# Patient Record
Sex: Male | Born: 2000 | Race: Black or African American | Hispanic: No | Marital: Single | Smoking: Never smoker
Health system: Southern US, Community
[De-identification: ages and names within clinical notes are randomized; demographics above are authoritative.]

---
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
## 2020-08-19 DIAGNOSIS — Z202 Contact with and (suspected) exposure to infections with a predominantly sexual mode of transmission: Secondary | ICD-10-CM | POA: Diagnosis not present

## 2020-08-19 DIAGNOSIS — Z708 Other sex counseling: Secondary | ICD-10-CM | POA: Diagnosis not present

## 2020-08-19 DIAGNOSIS — Z113 Encounter for screening for infections with a predominantly sexual mode of transmission: Secondary | ICD-10-CM | POA: Diagnosis not present

## 2020-08-19 DIAGNOSIS — Z114 Encounter for screening for human immunodeficiency virus [HIV]: Secondary | ICD-10-CM | POA: Diagnosis not present

## 2021-04-15 DIAGNOSIS — Z112 Encounter for screening for other bacterial diseases: Secondary | ICD-10-CM | POA: Diagnosis not present

## 2021-04-15 DIAGNOSIS — Z114 Encounter for screening for human immunodeficiency virus [HIV]: Secondary | ICD-10-CM | POA: Diagnosis not present

## 2021-04-15 DIAGNOSIS — Z0389 Encounter for observation for other suspected diseases and conditions ruled out: Secondary | ICD-10-CM | POA: Diagnosis not present

## 2021-04-15 DIAGNOSIS — R360 Urethral discharge without blood: Secondary | ICD-10-CM | POA: Diagnosis not present

## 2021-04-15 DIAGNOSIS — Z113 Encounter for screening for infections with a predominantly sexual mode of transmission: Secondary | ICD-10-CM | POA: Diagnosis not present

## 2021-04-15 DIAGNOSIS — A5401 Gonococcal cystitis and urethritis, unspecified: Secondary | ICD-10-CM | POA: Diagnosis not present

## 2021-06-04 ENCOUNTER — Other Ambulatory Visit: Payer: Self-pay

## 2021-06-04 ENCOUNTER — Ambulatory Visit (HOSPITAL_COMMUNITY)
Admission: EM | Admit: 2021-06-04 | Discharge: 2021-06-04 | Discharge status: Against Medical Advice | Payer: Medicaid Other

## 2021-06-05 ENCOUNTER — Encounter (HOSPITAL_COMMUNITY): Payer: Self-pay

## 2021-06-05 ENCOUNTER — Ambulatory Visit (INDEPENDENT_AMBULATORY_CARE_PROVIDER_SITE_OTHER): Payer: Medicaid Other

## 2021-06-05 ENCOUNTER — Other Ambulatory Visit: Payer: Self-pay

## 2021-06-05 ENCOUNTER — Ambulatory Visit (HOSPITAL_COMMUNITY)
Admission: EM | Admit: 2021-06-05 | Discharge: 2021-06-05 | Disposition: A | Discharge status: Home / Self Care | Payer: Medicaid Other | Attending: Family Medicine | Admitting: Family Medicine

## 2021-06-05 VITALS — BP 115/76 | HR 75 | Temp 98.2°F | Resp 18

## 2021-06-05 DIAGNOSIS — S62635A Displaced fracture of distal phalanx of left ring finger, initial encounter for closed fracture: Secondary | ICD-10-CM

## 2021-06-05 MED ORDER — TRAMADOL HCL 50 MG PO TABS: 50.0000 mg | ORAL_TABLET | Freq: Four times a day (QID) | ORAL | 0 refills | Status: AC | PRN

## 2021-06-05 MED ORDER — IBUPROFEN 800 MG PO TABS: 800.0000 mg | ORAL_TABLET | Freq: Three times a day (TID) | ORAL | 0 refills | Status: AC | PRN
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
## 2021-06-05 NOTE — ED Provider Notes (Signed)
[facility]    CSN: [id number] Arrival date & time: 06/05/21  1303      History   Chief Complaint Chief Complaint  Patient presents with   APPOINTMENT: Finger INjury     HPI Benjamin Gordon is a 21 y.o. male.   HPI Here with left ring finger pain for about 4 or 5 days.  He states he too much pressure on it.  He is unable to describe the mechanism of injury.  He thinks that with the finger bent that he hyperflexed his DIP joint of the finger  History reviewed. No pertinent past medical history.  There are no problems to display for this patient.   History reviewed. No pertinent surgical history.     Home Medications    Prior to Admission medications   Medication Sig Start Date End Date Taking? Authorizing Provider  ibuprofen (ADVIL) 800 MG tablet Take 1 tablet (800 mg total) by mouth every 8 (eight) hours as needed (pain). 06/05/21  Yes Emmelina Mcloughlin, Janace Aris, MD  traMADol (ULTRAM) 50 MG tablet Take 1 tablet (50 mg total) by mouth every 6 (six) hours as needed. 06/05/21  Yes Demetrice Combes, Janace Aris, MD    Family History Family History  Family history unknown: Yes    Social History Social History   Tobacco Use   Smoking status: Never   Smokeless tobacco: Never  Substance Use Topics   Alcohol use: No   Drug use: No     Allergies   Patient has no known allergies.   Review of Systems Review of Systems   Physical Exam Triage Vital Signs ED Triage Vitals  Enc Vitals Group     BP 06/05/21 1334 115/76     Pulse Rate 06/05/21 1334 75     Resp 06/05/21 1334 18     Temp 06/05/21 1334 98.2 F (36.8 C)     Temp Source 06/05/21 1334 Oral     SpO2 06/05/21 1334 98 %     Weight --      Height --      Head Circumference --      Peak Flow --      Pain Score 06/05/21 1333 7     Pain Loc --      Pain Edu? --      Excl. in GC? --    No data found.  Updated Vital Signs BP 115/76 (BP Location: Left Arm)    Pulse 75    Temp 98.2 F (36.8 C) (Oral)    Resp  18    SpO2 98%   Visual Acuity Right Eye Distance:   Left Eye Distance:   Bilateral Distance:    Right Eye Near:   Left Eye Near:    Bilateral Near:     Physical Exam Vitals reviewed.  Constitutional:      General: He is not in acute distress.    Appearance: He is not toxic-appearing.  Musculoskeletal:     Comments: There is swelling and tenderness of the left ring finger at the DIP joint and over the middle phalanx.  He cannot extend it at that joint fully  Skin:    Capillary Refill: Capillary refill takes less than 2 seconds.     Coloration: Skin is not jaundiced or pale.  Neurological:     General: No focal deficit present.     Mental Status: He is alert and oriented to person, place, and time.  Psychiatric:  Behavior: Behavior normal.     UC Treatments / Results  Labs (all labs ordered are listed, but only abnormal results are displayed) Labs Reviewed - No data to display  EKG   Radiology DG Hand Complete Left  Result Date: 06/05/2021 CLINICAL DATA:  Injury. Left ring injury after bending back 5 days ago. EXAM: LEFT HAND - COMPLETE 3+ VIEW COMPARISON:  None. FINDINGS: Normal bone mineralization. There is oblique linear lucency and minimal less than 1 mm diastasis of a fracture line at the dorsal base of the distal phalanx of the fourth finger. No dislocation. IMPRESSION: Avulsion fracture of the dorsal base of the distal phalanx of the fourth finger. This is suggestive of an avulsion injury at the insertion of the common extensor tendon (mallet finger). Electronically Signed   By: Neita Garnet M.D.   On: 06/05/2021 13:55    Procedures Procedures (including critical care time)  Medications Ordered in UC Medications - No data to display  Initial Impression / Assessment and Plan / UC Course  I have reviewed the triage vital signs and the nursing notes.  Pertinent labs & imaging results that were available during my care of the patient were reviewed by me and  considered in my medical decision making (see chart for details).     There is an avulsion fracture involving the distal phalanx on the ring finger.  We will splint and provide pain relief he is given information to contact a hand surgeon Final Clinical Impressions(s) / UC Diagnoses   Final diagnoses:  Displaced fracture of distal phalanx of left ring finger, initial encounter for closed fracture     Discharge Instructions      You have a broken bone at the last joint of your ring finger.  You can take ibuprofen 800 mg 1 every 8 hours as needed for pain.  It will not make you sleepy.  You could instead take tramadol 50 mg 1 every 6 hours as needed for pain; it can make you sleepy and may be a little stronger       ED Prescriptions     Medication Sig Dispense Auth. Provider   ibuprofen (ADVIL) 800 MG tablet Take 1 tablet (800 mg total) by mouth every 8 (eight) hours as needed (pain). 21 tablet Rhythm Gubbels, Janace Aris, MD   traMADol (ULTRAM) 50 MG tablet Take 1 tablet (50 mg total) by mouth every 6 (six) hours as needed. 10 tablet Marlinda Mike Janace Aris, MD      I have reviewed the PDMP during this encounter. Nothing on pmp   Zenia Resides, MD 06/05/21 1419

## 2021-06-05 NOTE — Discharge Instructions (Addendum)
You have a broken bone at the last joint of your ring finger.  You can take ibuprofen 800 mg 1 every 8 hours as needed for pain.  It will not make you sleepy.  You could instead take tramadol 50 mg 1 every 6 hours as needed for pain; it can make you sleepy and may be a little stronger

## 2022-08-23 ENCOUNTER — Telehealth: Payer: Self-pay

## 2022-08-23 NOTE — Telephone Encounter (Signed)
LVM for patient to call back. AS< CMA 

## 2022-12-02 ENCOUNTER — Emergency Department (HOSPITAL_COMMUNITY)
Admission: EM | Admit: 2022-12-02 | Discharge: 2022-12-02 | Disposition: A | Discharge status: Home / Self Care | Payer: Medicaid Other | Attending: Emergency Medicine | Admitting: Emergency Medicine

## 2022-12-02 ENCOUNTER — Other Ambulatory Visit: Payer: Self-pay

## 2022-12-02 DIAGNOSIS — K529 Noninfective gastroenteritis and colitis, unspecified: Secondary | ICD-10-CM | POA: Insufficient documentation

## 2022-12-02 DIAGNOSIS — Z202 Contact with and (suspected) exposure to infections with a predominantly sexual mode of transmission: Secondary | ICD-10-CM | POA: Diagnosis not present

## 2022-12-02 DIAGNOSIS — R1110 Vomiting, unspecified: Secondary | ICD-10-CM | POA: Diagnosis present

## 2022-12-02 DIAGNOSIS — A599 Trichomoniasis, unspecified: Secondary | ICD-10-CM | POA: Diagnosis not present

## 2022-12-02 LAB — URINALYSIS, ROUTINE W REFLEX MICROSCOPIC
Bilirubin Urine: NEGATIVE
Glucose, UA: NEGATIVE mg/dL
Hgb urine dipstick: NEGATIVE
Ketones, ur: 20 mg/dL — AB
Leukocytes,Ua: NEGATIVE
Nitrite: NEGATIVE
Protein, ur: 30 mg/dL — AB
Specific Gravity, Urine: 1.027 (ref 1.005–1.030)
pH: 5 (ref 5.0–8.0)

## 2022-12-02 MED ORDER — METRONIDAZOLE 500 MG PO TABS: 500.0000 mg | ORAL_TABLET | Freq: Two times a day (BID) | ORAL | 0 refills | Status: AC

## 2022-12-02 MED ORDER — ONDANSETRON 4 MG PO TBDP: 4.0000 mg | ORAL_TABLET | Freq: Three times a day (TID) | ORAL | 0 refills | Status: AC | PRN

## 2022-12-02 NOTE — ED Triage Notes (Signed)
Patient initial report is that he just wants a check up. On further questioning he states he was vomiting yesterday but not today. States his girlfriend is pregnant and he thinks he was vomiting because she was vomiting. At the end of triage adds that he wants to be checked for STDs.

## 2022-12-02 NOTE — Discharge Instructions (Addendum)
Your workup was reassuring.  Given your exposure to trichomonas.  Flagyl.  Additional STI panel with trichomonas and gonorrhea sent.  This will take a couple days to result.  If these are positive you will be given a phone call.  Otherwise you can follow-up on your results on MyChart.  Also prescribed you Zofran to help facilitate your p.o. intake.  For any concerning symptoms return to the emergency room.

## 2022-12-02 NOTE — ED Provider Notes (Signed)
[place] EMERGENCY DEPARTMENT AT [facility] Provider Note   CSN: [id number] Arrival date & time: 12/02/22  1048     History  No chief complaint on file.   Benjamin Gordon is a 22 y.o. male.  22 year old male presents today for concern of vomiting and diarrhea.  He states he ate [employer] for 3 days straight and feels he has food poisoning.  He denies any abdominal pain, fever, blood in stool.  He states he was also told by his significant other that she was diagnosed with trichomonas.  He denies any dysuria, penile discharge, penile lesions or any other concerns.  The history is provided by the patient. No language interpreter was used.       Home Medications Prior to Admission medications   Medication Sig Start Date End Date Taking? Authorizing Provider  ibuprofen (ADVIL) 800 MG tablet Take 1 tablet (800 mg total) by mouth every 8 (eight) hours as needed (pain). 06/05/21   Zenia Resides, MD  traMADol (ULTRAM) 50 MG tablet Take 1 tablet (50 mg total) by mouth every 6 (six) hours as needed. 06/05/21   Zenia Resides, MD      Allergies    Patient has no known allergies.    Review of Systems   Review of Systems  Constitutional:  Negative for chills and fever.  Respiratory:  Negative for shortness of breath.   Gastrointestinal:  Positive for diarrhea, nausea and vomiting. Negative for abdominal pain.  Genitourinary:  Negative for dysuria, genital sores, penile discharge, penile pain and penile swelling.  All other systems reviewed and are negative.   Physical Exam Updated Vital Signs BP (!) 119/96 (BP Location: Left Arm)   Pulse 95   Temp (!) 97.5 F (36.4 C) (Oral)   Resp 20   Ht 6\' 5"  (1.956 m)   Wt 70.3 kg   SpO2 100%   BMI 18.38 kg/m  Physical Exam Vitals and nursing note reviewed.  Constitutional:      General: He is not in acute distress.    Appearance: Normal appearance. He is not ill-appearing.  HENT:     Head: Normocephalic and  atraumatic.     Nose: Nose normal.  Eyes:     General: No scleral icterus.    Extraocular Movements: Extraocular movements intact.     Conjunctiva/sclera: Conjunctivae normal.  Cardiovascular:     Rate and Rhythm: Normal rate and regular rhythm.     Heart sounds: Normal heart sounds.  Pulmonary:     Effort: Pulmonary effort is normal. No respiratory distress.     Breath sounds: Normal breath sounds. No wheezing or rales.  Abdominal:     General: There is no distension.     Palpations: Abdomen is soft.     Tenderness: There is no abdominal tenderness. There is no guarding.  Genitourinary:    Comments: GU exam deferred Musculoskeletal:        General: Normal range of motion.     Cervical back: Normal range of motion.  Skin:    General: Skin is warm and dry.  Neurological:     General: No focal deficit present.     Mental Status: He is alert. Mental status is at baseline.     ED Results / Procedures / Treatments   Labs (all labs ordered are listed, but only abnormal results are displayed) Labs Reviewed  URINALYSIS, ROUTINE W REFLEX MICROSCOPIC  GC/CHLAMYDIA PROBE AMP ([place]) NOT AT [facility]    EKG  None  Radiology No results found.  Procedures Procedures    Medications Ordered in ED Medications - No data to display  ED Course/ Medical Decision Making/ A&P                                 Medical Decision Making Amount and/or Complexity of Data Reviewed Labs: ordered.  Risk Prescription drug management.   22 year old male presents today for symptoms consistent with gastroenteritis.  Exam is overall reassuring.  Vital signs are reassuring.  We discussed labs, fluids, and potential imaging however patient defers and would like symptomatic management and outpatient basis and he will return for any concerning symptoms.  Will treat for trichomonas given his partner was confirmed positive.  He would also like additional GC and Chlamydia testing.  Will send these  off and check his UA.  UA without evidence of UTI.  Discharged with Flagyl and Zofran.   Final Clinical Impression(s) / ED Diagnoses Final diagnoses:  Trichomonas exposure  Gastroenteritis    Rx / DC Orders ED Discharge Orders          Ordered    ondansetron (ZOFRAN-ODT) 4 MG disintegrating tablet  Every 8 hours PRN        12/02/22 1452    metroNIDAZOLE (FLAGYL) 500 MG tablet  2 times daily        12/02/22 1452              Marita [place], PA-C 12/02/22 1523    Elayne Snare K, DO 12/02/22 1556

## 2022-12-03 LAB — GC/CHLAMYDIA PROBE AMP (~~LOC~~) NOT AT ARMC
Chlamydia: POSITIVE — AB
Comment: NEGATIVE
Comment: NORMAL
Neisseria Gonorrhea: NEGATIVE

## 2023-04-04 IMAGING — DX DG HAND COMPLETE 3+V*L*
3 series · 3 of 3 positions shown · non-contrast
Comparison: None.

CLINICAL DATA: Injury. Left ring injury after bending back 5 days
ago.

EXAM:
LEFT HAND - COMPLETE 3+ VIEW

[hand pa]
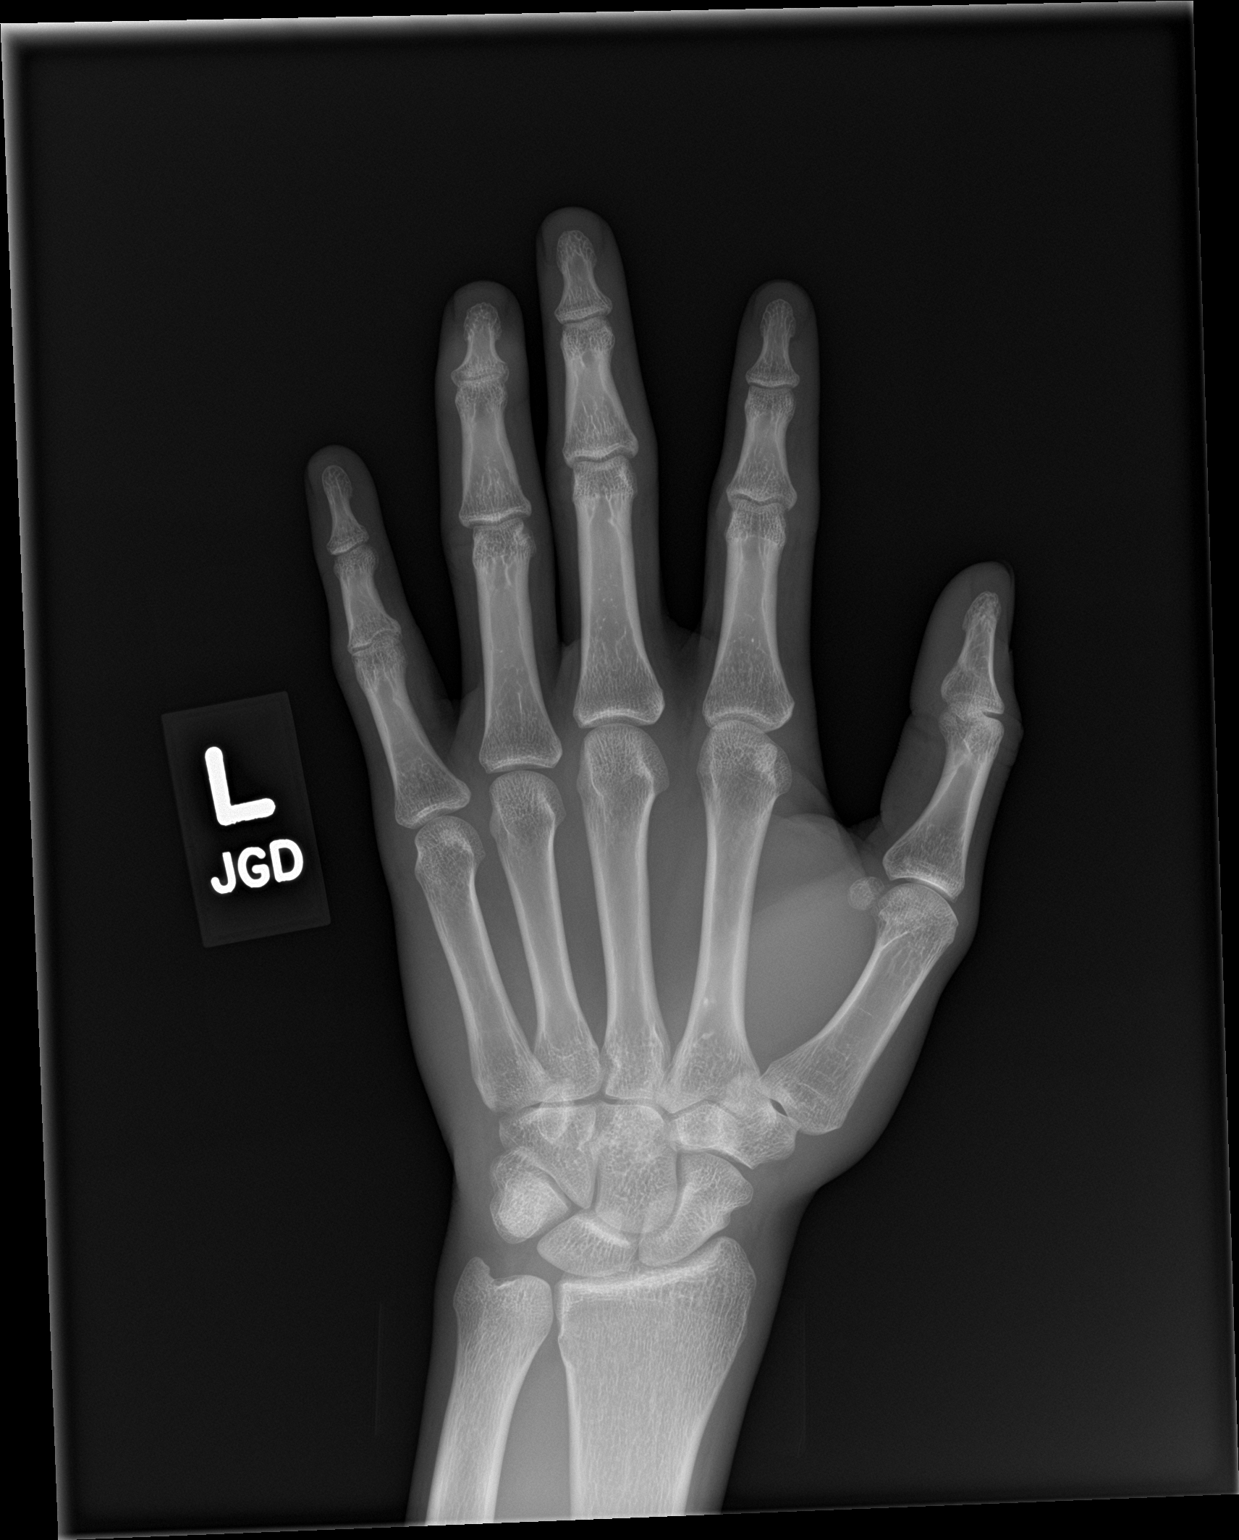

[hand obl]
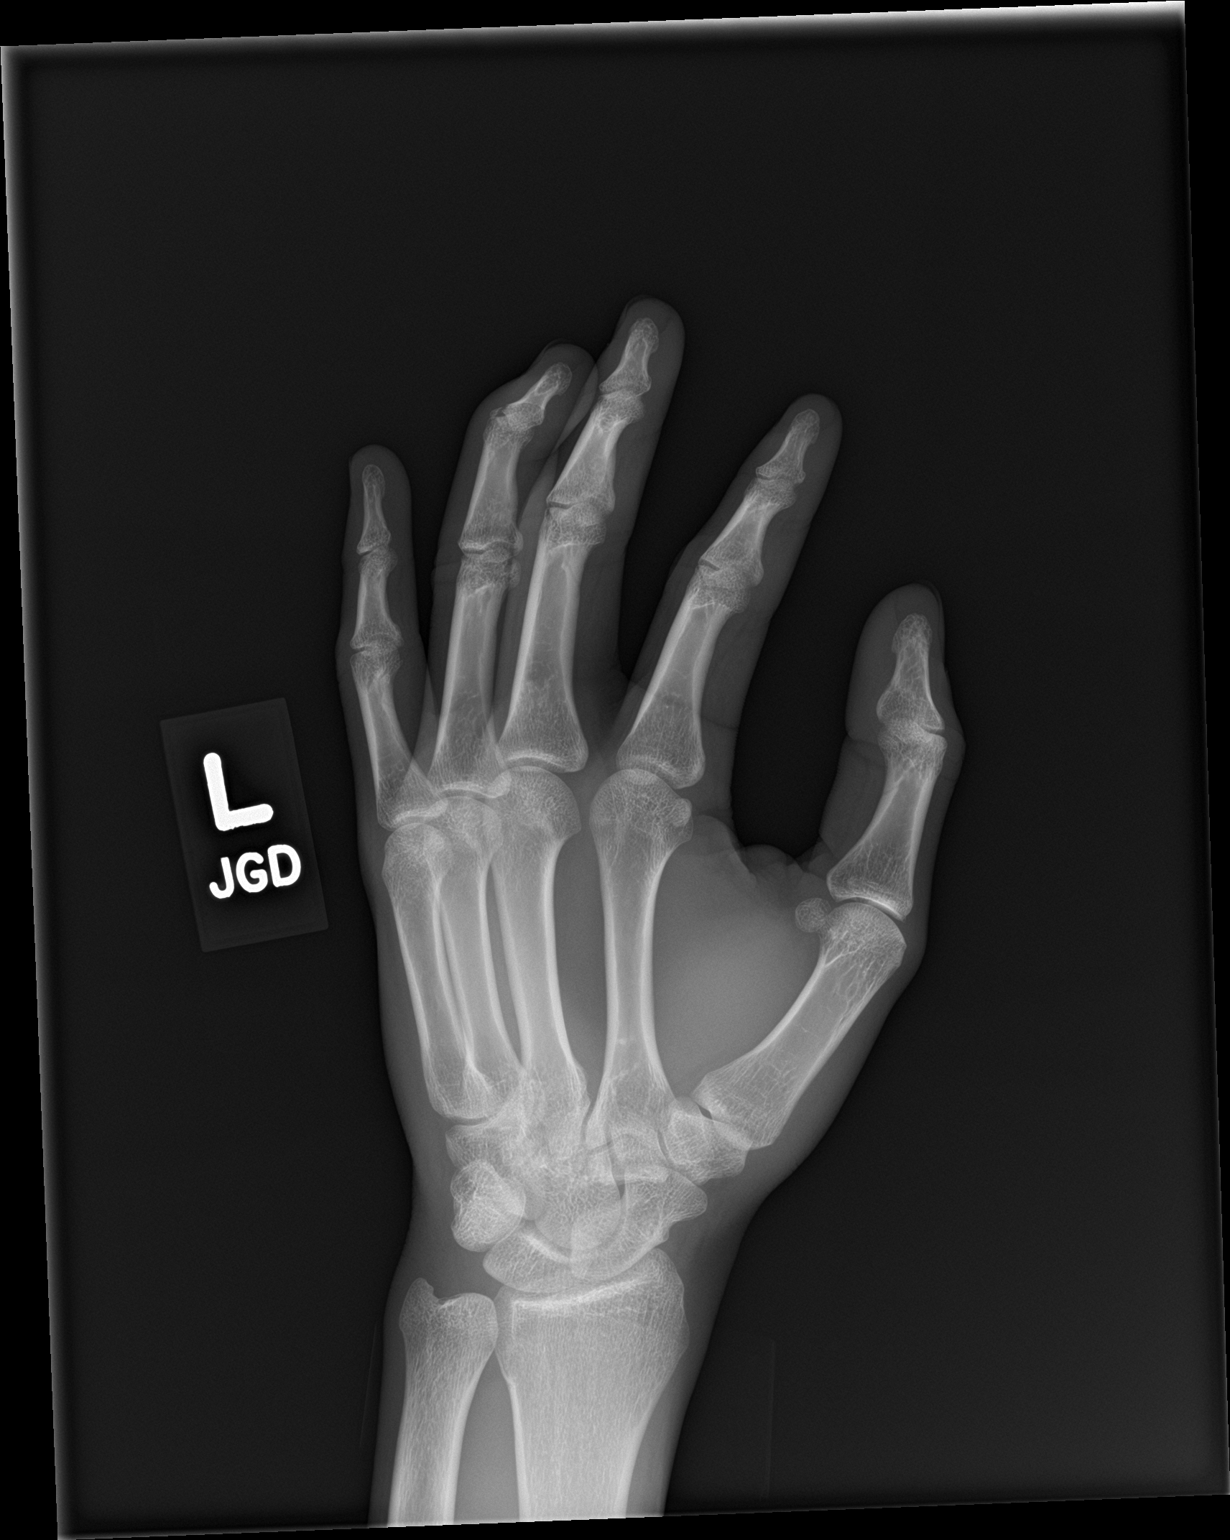

[hand lat]
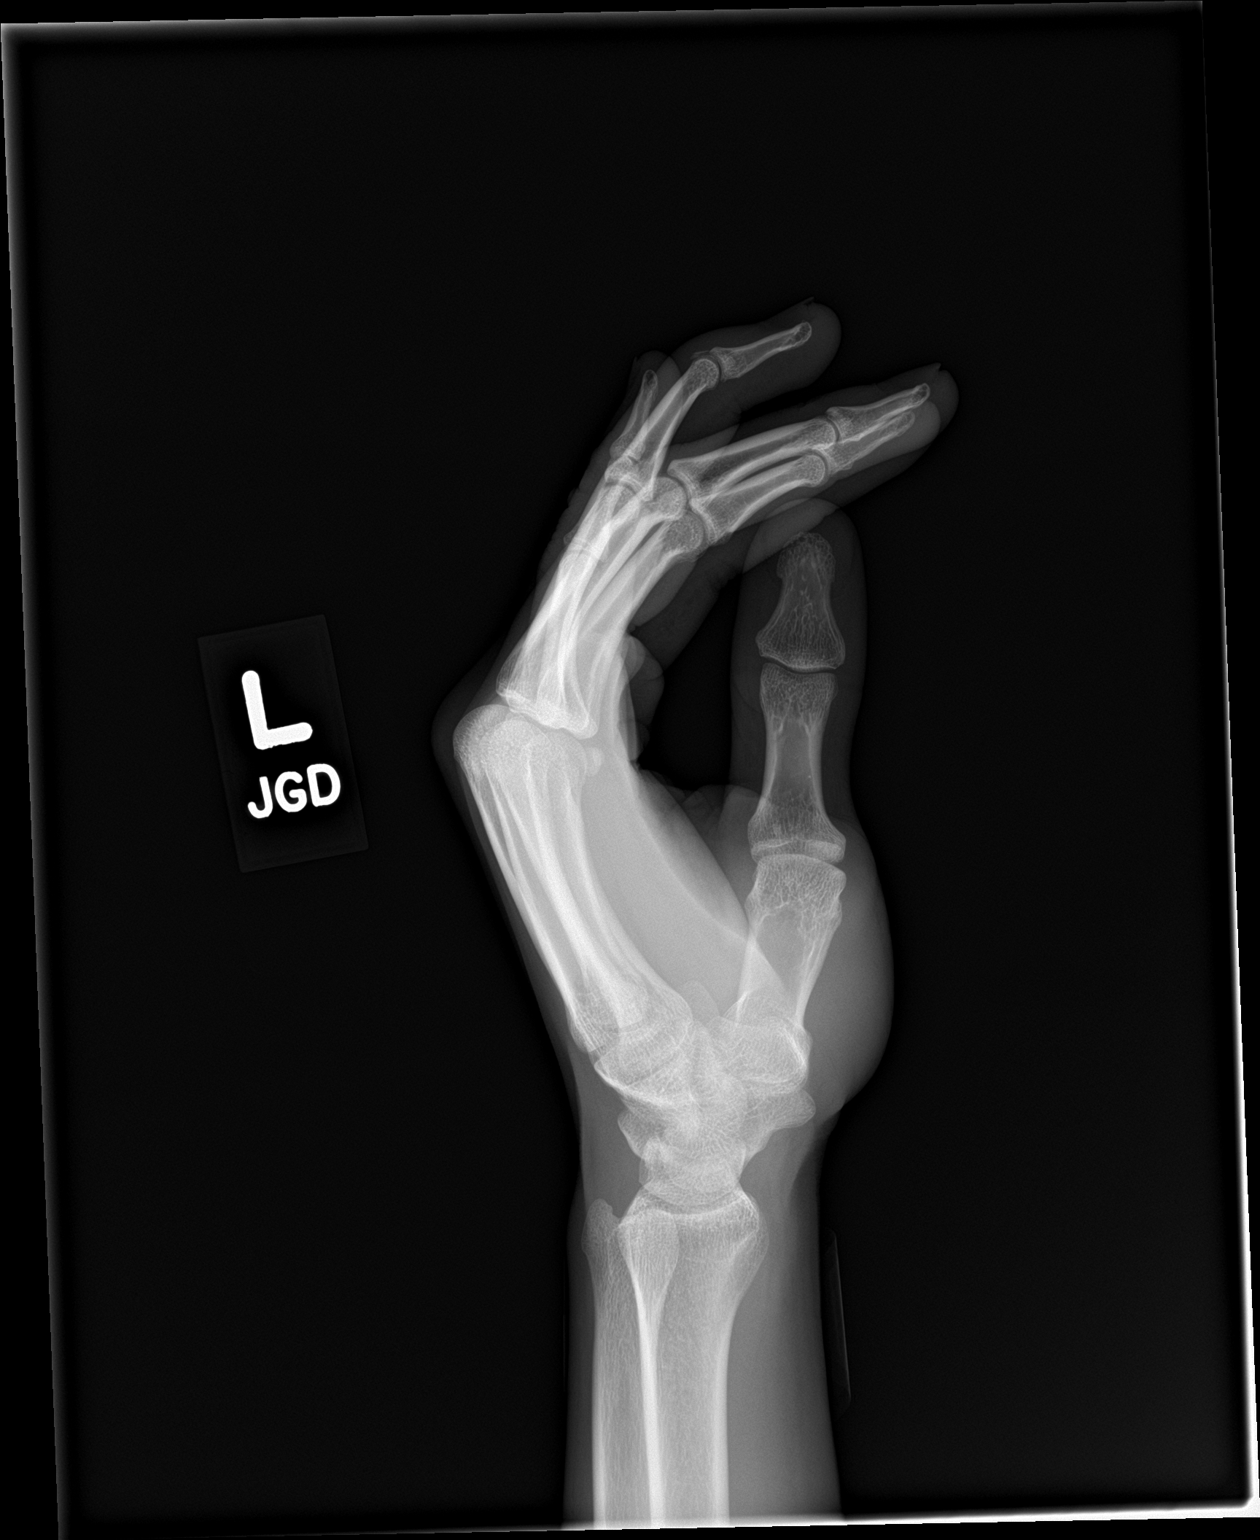

[3 of 3 positions shown; findings below may reference images not displayed]

FINDINGS: Normal bone mineralization. There is oblique linear lucency and
minimal less than 1 mm diastasis of a fracture line at the dorsal
base of the distal phalanx of the fourth finger. No dislocation.
IMPRESSION: Avulsion fracture of the dorsal base of the distal phalanx of the
fourth finger. This is suggestive of an avulsion injury at the
insertion of the common extensor tendon (mallet finger).
